# Patient Record
Sex: Female | Born: 1980 | Race: White | Hispanic: No | Marital: Married | Smoking: Never smoker
Health system: Southern US, Community
[De-identification: ages and names within clinical notes are randomized; demographics above are authoritative.]

## PROBLEM LIST (undated history)

## (undated) DIAGNOSIS — R87619 Unspecified abnormal cytological findings in specimens from cervix uteri: Secondary | ICD-10-CM

## (undated) HISTORY — DX: Unspecified abnormal cytological findings in specimens from cervix uteri: R87.619

## (undated) HISTORY — PX: OTHER SURGICAL HISTORY: SHX169

---
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
## 2002-08-13 HISTORY — PX: KNEE SURGERY: SHX244

## 2008-05-30 ENCOUNTER — Observation Stay: Payer: Self-pay | Admitting: Certified Nurse Midwife

## 2008-06-03 ENCOUNTER — Inpatient Hospital Stay: Payer: Self-pay

## 2011-01-03 ENCOUNTER — Inpatient Hospital Stay: Payer: Self-pay

## 2017-05-09 ENCOUNTER — Other Ambulatory Visit: Payer: Self-pay | Admitting: Certified Nurse Midwife

## 2017-07-30 ENCOUNTER — Other Ambulatory Visit: Payer: Self-pay | Admitting: Certified Nurse Midwife

## 2017-08-09 MED ORDER — DROSPIRENONE-ETHINYL ESTRADIOL 3-0.03 MG PO TABS: 1.0000 | ORAL_TABLET | Freq: Every day | ORAL | 0 refills | Status: DC

## 2017-08-09 NOTE — Addendum Note (Signed)
Addended by: Kathlene CoteSTANLEY, Jacy Brocker G on: 08/09/2017 02:59 PM   Modules accepted: Orders

## 2017-08-09 NOTE — Telephone Encounter (Signed)
Pt has been stuck in a cycle of being unable to get an apt or refill of OCP. She has scheduled her AE 09/02/17. Pt requesting refill of (YASMIN,ZARAH,SYEDA) 3-0.03 MG tablet to get her to her apt.

## 2017-08-22 ENCOUNTER — Encounter: Payer: Self-pay | Admitting: Obstetrics and Gynecology

## 2017-08-22 ENCOUNTER — Ambulatory Visit (INDEPENDENT_AMBULATORY_CARE_PROVIDER_SITE_OTHER): Payer: BC Managed Care – PPO | Admitting: Obstetrics and Gynecology

## 2017-08-22 VITALS — BP 120/80 | HR 74 | Ht 65.0 in | Wt 183.0 lb

## 2017-08-22 DIAGNOSIS — N3001 Acute cystitis with hematuria: Secondary | ICD-10-CM | POA: Diagnosis not present

## 2017-08-22 LAB — POCT URINALYSIS DIPSTICK
BILIRUBIN UA: NEGATIVE
GLUCOSE UA: NEGATIVE
Ketones, UA: NEGATIVE
Nitrite, UA: NEGATIVE
PH UA: 6.5 (ref 5.0–8.0)
Protein, UA: NEGATIVE
Spec Grav, UA: 1.01 (ref 1.010–1.025)

## 2017-08-22 MED ORDER — NITROFURANTOIN MONOHYD MACRO 100 MG PO CAPS: 100.0000 mg | ORAL_CAPSULE | Freq: Two times a day (BID) | ORAL | 0 refills | Status: AC

## 2017-08-22 NOTE — Progress Notes (Signed)
Chief Complaint  Patient presents with  . Cystitis    HPI:      Jodi Oneill is a 37 y.o. [id number] who LMP was Patient's last menstrual period was 08/10/2017., presents today for UTI sx of dysuria, frequency, urgency, hematuria for the past 3 days. No LBP, belly pain, fevers, vag sx. Hx of UTIs in the past.    Past Medical History:  Diagnosis Date  . Abnormal Pap smear of cervix    ascus    Past Surgical History:  Procedure Laterality Date  . KNEE SURGERY      Family History  Problem Relation Age of Onset  . Hypertension Mother   . Thyroid cancer Mother   . Uterine cancer Mother   . Hypertension Father     Social History   Socioeconomic History  . Marital status: Single    Spouse name: Not on file  . Number of children: Not on file  . Years of education: Not on file  . Highest education level: Not on file  Social Needs  . Financial resource strain: Not on file  . Food insecurity - worry: Not on file  . Food insecurity - inability: Not on file  . Transportation needs - medical: Not on file  . Transportation needs - non-medical: Not on file  Occupational History  . Not on file  Tobacco Use  . Smoking status: Never Smoker  . Smokeless tobacco: Never Used  Substance and Sexual Activity  . Alcohol use: Yes  . Drug use: No  . Sexual activity: Yes    Birth control/protection: Pill  Other Topics Concern  . Not on file  Social History Narrative  . Not on file     Current Outpatient Medications:  .  drospirenone-ethinyl estradiol (YASMIN,ZARAH,SYEDA) 3-0.03 MG tablet, Take 1 tablet by mouth daily., Disp: 84 tablet, Rfl: 0 .  nitrofurantoin, macrocrystal-monohydrate, (MACROBID) 100 MG capsule, Take 1 capsule (100 mg total) by mouth 2 (two) times daily for 5 days., Disp: 10 capsule, Rfl: 0   ROS:  Review of Systems  Constitutional: Negative for fever.  Gastrointestinal: Negative for blood in stool, constipation, diarrhea, nausea and vomiting.    Genitourinary: Positive for dysuria, frequency, hematuria and urgency. Negative for dyspareunia, flank pain, vaginal bleeding, vaginal discharge and vaginal pain.  Musculoskeletal: Negative for back pain.  Skin: Negative for rash.     OBJECTIVE:   Vitals:  BP 120/80   Pulse 74   Ht 5\' 5"  (1.651 m)   Wt 183 lb (83 kg)   LMP 08/10/2017   BMI 30.45 kg/m   Physical Exam  Constitutional: She is oriented to person, place, and time and well-developed, well-nourished, and in no distress.  Abdominal: There is no CVA tenderness.  Neurological: She is alert and oriented to person, place, and time.  Psychiatric: Affect and judgment normal.  Vitals reviewed.   Results: Results for orders placed or performed in visit on 08/22/17 (from the past 24 hour(s))  POCT Urinalysis Dipstick     Status: Abnormal   Collection Time: 08/22/17  8:44 AM  Result Value Ref Range   Color, UA yellow    Clarity, UA clear    Glucose, UA neg    Bilirubin, UA neg    Ketones, UA neg    Spec Grav, UA 1.010 1.010 - 1.025   Blood, UA large    pH, UA 6.5 5.0 - 8.0   Protein, UA neg    Urobilinogen, UA  0.2  or 1.0 E.U./dL   Nitrite, UA neg    Leukocytes, UA Moderate (2+) (A) Negative   Appearance     Odor       Assessment/Plan: Acute cystitis with hematuria - Rx macrobid. Check C&S. F/u prn. - Plan: POCT Urinalysis Dipstick, Urine Culture, nitrofurantoin, macrocrystal-monohydrate, (MACROBID) 100 MG capsule    Meds ordered this encounter  Medications  . nitrofurantoin, macrocrystal-monohydrate, (MACROBID) 100 MG capsule    Sig: Take 1 capsule (100 mg total) by mouth 2 (two) times daily for 5 days.    Dispense:  10 capsule    Refill:  0      Return if symptoms worsen or fail to improve.  Alicia B. Copland, PA-C 08/22/2017 8:45 AM

## 2017-08-22 NOTE — Patient Instructions (Signed)
I value your feedback and entrusting us with your care. If you get a [facility] patient survey, I would appreciate you taking the time to let us know about your experience today. Thank you! 

## 2017-08-24 LAB — URINE CULTURE

## 2017-09-02 ENCOUNTER — Ambulatory Visit (INDEPENDENT_AMBULATORY_CARE_PROVIDER_SITE_OTHER): Payer: BC Managed Care – PPO | Admitting: Certified Nurse Midwife

## 2017-09-02 ENCOUNTER — Encounter: Payer: Self-pay | Admitting: Certified Nurse Midwife

## 2017-09-02 VITALS — BP 120/70 | HR 66 | Ht 65.0 in | Wt 183.0 lb

## 2017-09-02 DIAGNOSIS — Z131 Encounter for screening for diabetes mellitus: Secondary | ICD-10-CM | POA: Diagnosis not present

## 2017-09-02 DIAGNOSIS — E782 Mixed hyperlipidemia: Secondary | ICD-10-CM | POA: Insufficient documentation

## 2017-09-02 DIAGNOSIS — Z124 Encounter for screening for malignant neoplasm of cervix: Secondary | ICD-10-CM | POA: Diagnosis not present

## 2017-09-02 DIAGNOSIS — Z23 Encounter for immunization: Secondary | ICD-10-CM | POA: Diagnosis not present

## 2017-09-02 DIAGNOSIS — Z01419 Encounter for gynecological examination (general) (routine) without abnormal findings: Secondary | ICD-10-CM | POA: Diagnosis not present

## 2017-09-02 DIAGNOSIS — Z309 Encounter for contraceptive management, unspecified: Secondary | ICD-10-CM | POA: Insufficient documentation

## 2017-09-02 MED ORDER — DROSPIRENONE-ETHINYL ESTRADIOL 3-0.03 MG PO TABS: 1.0000 | ORAL_TABLET | Freq: Every day | ORAL | 3 refills | Status: DC

## 2017-09-02 MED ORDER — EPINEPHRINE 0.3 MG/0.3ML IJ SOAJ: 0.3000 mg | Freq: Once | INTRAMUSCULAR | 2 refills | Status: DC | PRN

## 2017-09-02 NOTE — Progress Notes (Signed)
Gynecology Annual Exam  PCP: Patient, No Pcp Per  Chief Complaint:  Chief Complaint  Patient presents with  . Gynecologic Exam    History of Present Illness:Jodi Oneill is a 37 year old Caucasian/White female , G 2 P 2 0 0 2 , who presents for her annual gyn exam . She is not having any significant gynecological concerns. Has a history of stress urinary incontinence/ cystocele.Marland Kitchen Her symptoms have progressively improved with doing Kegel exercises and losing 25#, and is not currently problematic unless she has a cold with coughing and sneezing. Does not need to wear pads. At her last visit she complained of spotting between menses on her pill. This improved after taking additional estrogen pills x 2 cycles. She still has some light midcycle spotting x 2 days. ` Denies missing pills. No vaginitis symptoms.  Her menses are regular, are monthly and last 3-4 days with light-mod flow. Her LMP was 08/05/2017.   She denies dysmenorrhea  The patient's past medical history is notable for a history of two vaginal deliveries (2009/2012).  She is sexually active. She is currently using birth control pills for contraception.  Her most recent pap smear was obtained 06/11/2016 and was negative/negative HRHPV  Mammogram is not applicable.  There is a positive history of breast cancer in her maternal aunt. Genetic testing has not been done.  There is no family history of ovarian cancer.  The patient does not do monthly self breast exams.  The patient does not smoke.  The patient does drink 2-3 glasses of wine/week. The patient does not use illegal drugs.  The patient exercises regularly. Does yoga The patient does get adequate calcium in her diet.  She had a recent cholesterol screen in 2017 that was abnormal. TC 257, tri 180, LDL 144, HDL 77     Review of Systems: Review of Systems  Constitutional: Negative for chills, fever and weight loss.  HENT: Negative for congestion, sinus pain and  sore throat.   Eyes: Negative for blurred vision and pain.  Respiratory: Negative for hemoptysis, shortness of breath and wheezing.   Cardiovascular: Negative for chest pain, palpitations and leg swelling.  Gastrointestinal: Negative for abdominal pain, blood in stool, diarrhea, heartburn, nausea and vomiting.  Genitourinary: Negative for dysuria, frequency, hematuria and urgency.       Positive for BTB/ occasional SUI  Musculoskeletal: Negative for back pain, joint pain and myalgias.  Skin: Negative for itching and rash.  Neurological: Negative for dizziness, tingling and headaches.  Endo/Heme/Allergies: Negative for environmental allergies and polydipsia. Does not bruise/bleed easily.       Negative for hirsutism   Psychiatric/Behavioral: Negative for depression. The patient is not nervous/anxious and does not have insomnia.     Past Medical History:  Past Medical History:  Diagnosis Date  . Abnormal Pap smear of cervix    ascus    Past Surgical History:  Past Surgical History:  Procedure Laterality Date  . KNEE SURGERY Right 2004   fx patella    Family History:  Family History  Problem Relation Age of Onset  . Hypertension Mother   . Thyroid cancer Mother 47  . Uterine cancer Mother 33  . Skin cancer Mother   . Hypertension Father   . Breast cancer Maternal Aunt 46    Social History:  Social History   Socioeconomic History  . Marital status: Married    Spouse name: Not on file  . Number of children: 2  .  Years of education: Not on file  . Highest education level: Not on file  Social Needs  . Financial resource strain: Not on file  . Food insecurity - worry: Not on file  . Food insecurity - inability: Not on file  . Transportation needs - medical: Not on file  . Transportation needs - non-medical: Not on file  Occupational History  . Not on file  Tobacco Use  . Smoking status: Never Smoker  . Smokeless tobacco: Never Used  Substance and Sexual Activity    . Alcohol use: Yes    Alcohol/week: 1.2 - 1.8 oz    Types: 2 - 3 Glasses of wine per week  . Drug use: No  . Sexual activity: Yes    Birth control/protection: Pill  Other Topics Concern  . Not on file  Social History Narrative  . Not on file    Allergies:  Allergies  Allergen Reactions  . Penicillins Hives  and Beestings  Medications: Prior to Admission medications   Medication Sig Start Date End Date Taking? Authorizing Provider  drospirenone-ethinyl estradiol (YASMIN,ZARAH,SYEDA) 3-0.03 MG tablet Take 1 tablet by mouth daily. 08/09/17   Farrel ConnersGutierrez, Sameria Morss, CNM    Physical Exam Vitals: BP 120/70   Pulse 66   Ht 5\' 5"  (1.651 m)   Wt 183 lb (83 kg)   LMP 08/05/2017 (Approximate)   BMI 30.45 kg/m   General: WF in NAD HEENT: normocephalic, anicteric Neck: no thyroid enlargement, no palpable nodules, no cervical lymphadenopathy  Pulmonary: No increased work of breathing, CTAB Cardiovascular: RRR, without murmur  Breast: Breast symmetrical, no tenderness, no palpable nodules or masses, no skin or nipple retraction present, no nipple discharge.  No axillary, infraclavicular or supraclavicular lymphadenopathy. Abdomen: Soft, non-tender, non-distended.  Umbilicus without lesions.  No hepatomegaly or masses palpable. No evidence of hernia. Genitourinary:  External: Normal external female genitalia.  Normal urethral meatus, normal Bartholin's and Skene's glands.    Vagina: Normal vaginal mucosa, no evidence of prolapse.    Cervix: Grossly normal in appearance, scant brown-red discharge at os, non-tender  Uterus: Anteverted, normal size, shape, and consistency, mobile, and non-tender  Adnexa: No adnexal masses, non-tender  Rectal: deferred  Lymphatic: no evidence of inguinal lymphadenopathy Extremities: no edema, erythema, or tenderness Neurologic: Grossly intact Psychiatric: mood appropriate, affect full     Assessment: 37 y.o. G2P2002 normal gyn  exam Hyperlipidemia BTB on pills   Plan:   1) Breast cancer screening - recommend monthly self breast exam. Start mammograms at age 37.  2) Cervical cancer screening - Pap was done. ASCCP guidelines and rational discussed.  Patient opts for yearly screening interval  3) Contraception/ BTB- Wishes to continue on Yasmin. Declines change of pills or RX for additional estrogen. RX for Yasmin #3packs/ RF x 3 sent to RX. 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  4) Routine healthcare maintenance including cholesterol and diabetes labs ordered today. Flu vaccine today  5) RTO in 1 year and prn  Farrel Connersolleen Stephanne Greeley, CNM

## 2017-09-03 ENCOUNTER — Encounter: Payer: Self-pay | Admitting: Certified Nurse Midwife

## 2017-09-03 LAB — IGP,RFX APTIMA HPV ALL PTH: PAP SMEAR COMMENT: 0

## 2017-09-03 LAB — LIPID PANEL WITH LDL/HDL RATIO
CHOLESTEROL TOTAL: 239 mg/dL — AB (ref 100–199)
HDL: 81 mg/dL (ref >39)
LDL CALC: 136 mg/dL — AB (ref 0–99)
LDl/HDL Ratio: 1.7 ratio (ref 0.0–3.2)
Triglycerides: 110 mg/dL (ref 0–149)
VLDL Cholesterol Cal: 22 mg/dL (ref 5–40)

## 2017-09-03 LAB — HGB A1C W/O EAG: Hgb A1c MFr Bld: 5 % (ref 4.8–5.6)

## 2018-09-22 ENCOUNTER — Other Ambulatory Visit: Payer: Self-pay | Admitting: Certified Nurse Midwife

## 2018-09-29 NOTE — Progress Notes (Signed)
Gynecology Annual Exam  PCP: Patient, No Pcp Per  Chief Complaint:  Chief Complaint  Patient presents with  . Gynecologic Exam    History of Present Illness:Jodi Oneill is a 38 year old Caucasian/White female , G 2 P 2 0 0 2 , who presents for her annual gyn exam . She is not having any significant gyn complaints.  She continues to complain of mild BTB mid cycle x 2 days. Has taken supplemental estrogen in the past with some relief, but declined further treatment.  Her menses are regular, are monthly and last 3-4 days with light-mod flow. Her LMP was  09/01/2018 (she is actually starting a menses today)  She has mild dysmenorrhea relieved with Tylenol. The patient's past medical history is notable for a history of two vaginal deliveries (2009/2012).  She is sexually active. She is currently using birth control pills (yasmin generic) for contraception.  Her most recent pap smear was obtained 1/ 21/2019 and was NIL  Mammogram is not applicable.  There is a positive history of breast cancer in her maternal aunt. Genetic testing is not indicated. There is no family history of ovarian cancer.  The patient does do monthly self breast exams.  The patient does not smoke.  The patient does drink 2 glasses of wine/month The patient does not use illegal drugs.  The patient exercises regularly. Does yoga 3-4 times a week and walks her dogs daily. The patient does get adequate calcium in her diet.  She had a recent cholesterol screen in 2019 that was borderline TC 239, tri 110, LDL 136 HDL 81     Review of Systems: Review of Systems  Constitutional: Negative for chills, fever and weight loss.  HENT: Negative for congestion, sinus pain and sore throat.   Eyes: Negative for blurred vision and pain.  Respiratory: Negative for hemoptysis, shortness of breath and wheezing.   Cardiovascular: Negative for chest pain, palpitations and leg swelling.  Gastrointestinal: Negative for  abdominal pain, blood in stool, diarrhea, heartburn, nausea and vomiting.  Genitourinary: Negative for dysuria, frequency, hematuria and urgency.       Positive for BTB/ occasional SUI  Musculoskeletal: Negative for back pain, joint pain and myalgias.  Skin: Negative for itching and rash.  Neurological: Negative for dizziness, tingling and headaches.  Endo/Heme/Allergies: Negative for environmental allergies and polydipsia. Does not bruise/bleed easily.       Negative for hirsutism   Psychiatric/Behavioral: Negative for depression. The patient is not nervous/anxious and does not have insomnia.     Past Medical History:  Past Medical History:  Diagnosis Date  . Abnormal Pap smear of cervix    ascus    Past Surgical History:  Past Surgical History:  Procedure Laterality Date  . KNEE SURGERY Right 2004   fx patella    Family History:  Family History  Problem Relation Age of Onset  . Hypertension Mother   . Thyroid cancer Mother 49  . Uterine cancer Mother 82  . Melanoma Mother   . Hypertension Father   . Breast cancer Maternal Aunt 42    Social History:  Social History   Socioeconomic History  . Marital status: Married    Spouse name: Not on file  . Number of children: 2  . Years of education: Not on file  . Highest education level: Not on file  Occupational History  . Not on file  Social Needs  . Financial resource strain: Not on file  .  Food insecurity:    Worry: Not on file    Inability: Not on file  . Transportation needs:    Medical: Not on file    Non-medical: Not on file  Tobacco Use  . Smoking status: Never Smoker  . Smokeless tobacco: Never Used  Substance and Sexual Activity  . Alcohol use: Yes    Alcohol/week: 2.0 - 3.0 standard drinks    Types: 2 - 3 Glasses of wine per week  . Drug use: No  . Sexual activity: Yes    Birth control/protection: Pill  Lifestyle  . Physical activity:    Days per week: 5 days    Minutes per session: 30 min  .  Stress: Only a little  Relationships  . Social connections:    Talks on phone: More than three times a week    Gets together: Three times a week    Attends religious service: More than 4 times per year    Active member of club or organization: No    Attends meetings of clubs or organizations: Never    Relationship status: Married  . Intimate partner violence:    Fear of current or ex partner: No    Emotionally abused: No    Physically abused: No    Forced sexual activity: No  Other Topics Concern  . Not on file  Social History Narrative  . Not on file    Allergies:  Allergies  Allergen Reactions  . Other Anaphylaxis    Bee stings  . Penicillins Hives  and Beestings  Medications: Current Outpatient Medications on File Prior to Visit  Medication Sig Dispense Refill  . drospirenone-ethinyl estradiol (YASMIN,ZARAH,SYEDA) 3-0.03 MG tablet TAKE 1 TABLET BY MOUTH EVERY DAY 84 tablet 3   No current facility-administered medications on file prior to visit.        Physical Exam Vitals: BP 112/70   Pulse 78   Ht 5\' 5"  (1.651 m)   Wt 188 lb (85.3 kg)   LMP 09/01/2018 (Exact Date)   BMI 31.28 kg/m  General: WF in NAD HEENT: normocephalic, anicteric Neck: no thyroid enlargement, no palpable nodules, no cervical lymphadenopathy  Pulmonary: No increased work of breathing, CTAB Cardiovascular: RRR, without murmur  Breast: Breast symmetrical, no tenderness, no palpable nodules or masses, no skin or nipple retraction present, no nipple discharge.  No axillary, infraclavicular or supraclavicular lymphadenopathy. Abdomen: Soft, non-tender, non-distended.  Umbilicus without lesions.  No hepatomegaly or masses palpable. No evidence of hernia. Genitourinary:  External: Normal external female genitalia.  Normal urethral meatus, normal Bartholin's and Skene's glands.    Vagina: Normal vaginal mucosa, no evidence of prolapse, small amount of blood in vault.    Cervix: Grossly normal in  appearance, everted, non-tender  Uterus: Anteverted, normal size, shape, and consistency, mobile, and non-tender  Adnexa: No adnexal masses, non-tender  Rectal: deferred  Lymphatic: no evidence of inguinal lymphadenopathy Extremities: no edema, erythema, or tenderness Neurologic: Grossly intact Psychiatric: mood appropriate, affect full     Assessment: 38 y.o. G2P2002 normal gyn exam BTB on pills   Plan:   1) Breast cancer screening - recommend monthly self breast exam. Start mammograms at age 38.  2) Cervical cancer screening - Pap was done. ASCCP guidelines and rational discussed.  Patient opts for yearly screening interval  3) Contraception/ BTB- Wishes to continue on Yasmin. Declines change of pills or RX for additional estrogen. RX for Yasmin #3packs/ RF x 3 was already refilled earlier this month. 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  4) Routine healthcare maintenance including cholesterol and diabetes labs UTD. Reports getting a Td in 03/12/2016 after a hand injury. Next due in 10 years 5) RTO in 1 year and prn  Farrel Conners, CNM

## 2018-09-30 ENCOUNTER — Encounter: Payer: Self-pay | Admitting: Certified Nurse Midwife

## 2018-09-30 ENCOUNTER — Ambulatory Visit (INDEPENDENT_AMBULATORY_CARE_PROVIDER_SITE_OTHER): Payer: BC Managed Care – PPO | Admitting: Certified Nurse Midwife

## 2018-09-30 ENCOUNTER — Other Ambulatory Visit (HOSPITAL_COMMUNITY)
Admission: RE | Admit: 2018-09-30 | Discharge: 2018-09-30 | Disposition: A | Discharge status: Home / Self Care | Payer: BC Managed Care – PPO | Source: Ambulatory Visit | Attending: Certified Nurse Midwife | Admitting: Certified Nurse Midwife

## 2018-09-30 VITALS — BP 112/70 | HR 78 | Ht 65.0 in | Wt 188.0 lb

## 2018-09-30 DIAGNOSIS — Z124 Encounter for screening for malignant neoplasm of cervix: Secondary | ICD-10-CM

## 2018-09-30 DIAGNOSIS — Z01419 Encounter for gynecological examination (general) (routine) without abnormal findings: Secondary | ICD-10-CM | POA: Diagnosis present

## 2018-09-30 MED ORDER — EPINEPHRINE 0.3 MG/0.3ML IJ SOAJ: 0.3000 mg | Freq: Once | INTRAMUSCULAR | 2 refills | Status: DC | PRN

## 2018-10-02 LAB — CYTOLOGY - PAP
Diagnosis: NEGATIVE
HPV: NOT DETECTED

## 2019-01-12 HISTORY — PX: OTHER SURGICAL HISTORY: SHX169

## 2019-04-17 ENCOUNTER — Encounter: Payer: Self-pay | Admitting: Certified Nurse Midwife

## 2019-04-17 ENCOUNTER — Other Ambulatory Visit: Payer: Self-pay

## 2019-04-17 ENCOUNTER — Ambulatory Visit (INDEPENDENT_AMBULATORY_CARE_PROVIDER_SITE_OTHER): Payer: BC Managed Care – PPO | Admitting: Certified Nurse Midwife

## 2019-04-17 ENCOUNTER — Telehealth: Payer: Self-pay | Admitting: Certified Nurse Midwife

## 2019-04-17 VITALS — BP 112/76 | HR 76 | Ht 65.0 in | Wt 195.0 lb

## 2019-04-17 DIAGNOSIS — Z23 Encounter for immunization: Secondary | ICD-10-CM

## 2019-04-17 DIAGNOSIS — N6310 Unspecified lump in the right breast, unspecified quadrant: Secondary | ICD-10-CM | POA: Diagnosis not present

## 2019-04-17 NOTE — Addendum Note (Signed)
Addended by: Meryl Dare on: 04/17/2019 09:23 AM   Modules accepted: Orders

## 2019-04-17 NOTE — Progress Notes (Signed)
Obstetrics & Gynecology Office Visit   Chief Complaint:  Chief Complaint  Patient presents with  . Breast Problem    Right breast lump    History of Present Illness: 38 year old G2 P2002 with LMP 04/15/2019 presents with complaints of feeling a tender lump in the upper outer right breast x 1 week. She reports feeling the tenderness first, then feeling a 1-2 cm mass in the RUOQ. She feels like the lump has gotten smaller. She has no history of having mammograms in the past. Maternal aunt had breast cancer at age 38. Mother has had thyroid and uterine cancer and also had melanoma. Purity uses OCPs for contraception.  Caffeine intake is 2 cups of tea and 2 cans of diet Coke a day. Last breast exam was 09/2018 and was normal.   Review of Systems:  ROS   Past Medical History:  Past Medical History:  Diagnosis Date  . Abnormal Pap smear of cervix    ascus    Past Surgical History:  Past Surgical History:  Procedure Laterality Date  . KNEE SURGERY Right 2004   fx patella    Gynecologic History: Patient's last menstrual period was 04/15/2019.  Obstetric History: [id number]  Family History:  Family History  Problem Relation Age of Onset  . Hypertension Mother   . Thyroid cancer Mother 50  . Uterine cancer Mother 52  . Melanoma Mother   . Hypertension Father   . Breast cancer Maternal Aunt 63    Social History:  Social History   Socioeconomic History  . Marital status: Married    Spouse name: Not on file  . Number of children: 2  . Years of education: Not on file  . Highest education level: Not on file  Occupational History  . Not on file  Social Needs  . Financial resource strain: Not on file  . Food insecurity    Worry: Not on file    Inability: Not on file  . Transportation needs    Medical: Not on file    Non-medical: Not on file  Tobacco Use  . Smoking status: Never Smoker  . Smokeless tobacco: Never Used  Substance and Sexual Activity  . Alcohol use:  Yes    Alcohol/week: 2.0 - 3.0 standard drinks    Types: 2 - 3 Glasses of wine per week  . Drug use: No  . Sexual activity: Yes    Birth control/protection: Pill  Lifestyle  . Physical activity    Days per week: 5 days    Minutes per session: 30 min  . Stress: Only a little  Relationships  . Social connections    Talks on phone: More than three times a week    Gets together: Three times a week    Attends religious service: More than 4 times per year    Active member of club or organization: No    Attends meetings of clubs or organizations: Never    Relationship status: Married  . Intimate partner violence    Fear of current or ex partner: No    Emotionally abused: No    Physically abused: No    Forced sexual activity: No  Other Topics Concern  . Not on file  Social History Narrative  . Not on file    Allergies:  Allergies  Allergen Reactions  . Other Anaphylaxis    Bee stings  . Penicillins Hives    Medications: Prior to Admission medications   Medication Sig  Start Date End Date Taking? Authorizing Provider  drospirenone-ethinyl estradiol (YASMIN,ZARAH,SYEDA) 3-0.03 MG tablet TAKE 1 TABLET BY MOUTH EVERY DAY 09/22/18  Yes Farrel ConnersGutierrez, Gabi Mcfate, CNM  EPINEPHrine 0.3 mg/0.3 mL IJ SOAJ injection Inject 0.3 mLs (0.3 mg total) into the muscle once as needed for up to 1 dose (hypersensitivity reaction). 09/30/18  Yes Farrel ConnersGutierrez, Dosha Broshears, CNM    Physical Exam Vitals: BP 112/76 (BP Location: Right Arm, Patient Position: Sitting, Cuff Size: Normal)   Pulse 76   Ht 5\' 5"  (1.651 m)   Wt 195 lb (88.5 kg)   LMP 04/15/2019   BMI 32.45 kg/m      Physical Exam  Constitutional: She is oriented to person, place, and time. She appears well-developed and well-nourished. No distress.  Respiratory: Effort normal.  Musculoskeletal: Normal range of motion.  Neurological: She is alert and oriented to person, place, and time.  Skin: Skin is warm and dry.  Psychiatric: She has a normal  mood and affect.  Breasts: examined lying down and sitting up. Symmetrical in size, no skin or nipple changes, no nipple discharge. Exam while lying: no masses palpable in either breast. When sitting there was no dominant masses palpated, but some fibroglandular tissue/ fullness noted in upper outer quadrant of right breast, that was slightly tender   Assessment: 38 y.o. [id number] with possible mass in right upper outer quadrant  Plan: Schedule diagnostic mammogram and right breast ultrasound.  Farrel Connersolleen Jacarra Bobak, CNM

## 2019-04-17 NOTE — Telephone Encounter (Signed)
Patient aware of mammogram at [facility] on 04/23/2019 at 10:00 am

## 2019-04-23 ENCOUNTER — Ambulatory Visit
Admission: RE | Admit: 2019-04-23 | Discharge: 2019-04-23 | Disposition: A | Discharge status: Home / Self Care | Payer: BC Managed Care – PPO | Source: Ambulatory Visit | Attending: Certified Nurse Midwife | Admitting: Certified Nurse Midwife

## 2019-04-23 DIAGNOSIS — N6310 Unspecified lump in the right breast, unspecified quadrant: Secondary | ICD-10-CM | POA: Insufficient documentation

## 2019-08-14 DIAGNOSIS — D0362 Melanoma in situ of left upper limb, including shoulder: Secondary | ICD-10-CM

## 2019-08-14 HISTORY — PX: OTHER SURGICAL HISTORY: SHX169

## 2019-08-14 HISTORY — DX: Melanoma in situ of left upper limb, including shoulder: D03.62

## 2019-08-23 ENCOUNTER — Other Ambulatory Visit: Payer: Self-pay | Admitting: Certified Nurse Midwife

## 2019-11-09 ENCOUNTER — Other Ambulatory Visit: Payer: Self-pay | Admitting: Certified Nurse Midwife

## 2020-02-11 ENCOUNTER — Other Ambulatory Visit: Payer: Self-pay | Admitting: Certified Nurse Midwife

## 2020-02-23 ENCOUNTER — Encounter: Payer: Self-pay | Admitting: Certified Nurse Midwife

## 2020-02-23 ENCOUNTER — Other Ambulatory Visit: Payer: Self-pay

## 2020-02-23 ENCOUNTER — Ambulatory Visit (INDEPENDENT_AMBULATORY_CARE_PROVIDER_SITE_OTHER): Payer: BC Managed Care – PPO | Admitting: Certified Nurse Midwife

## 2020-02-23 VITALS — BP 130/80 | HR 72 | Ht 65.0 in | Wt 205.0 lb

## 2020-02-23 DIAGNOSIS — Z01419 Encounter for gynecological examination (general) (routine) without abnormal findings: Secondary | ICD-10-CM | POA: Diagnosis not present

## 2020-02-23 DIAGNOSIS — N921 Excessive and frequent menstruation with irregular cycle: Secondary | ICD-10-CM

## 2020-02-23 DIAGNOSIS — Z3041 Encounter for surveillance of contraceptive pills: Secondary | ICD-10-CM

## 2020-02-23 MED ORDER — LEVONORGEST-ETH ESTRAD 91-DAY 0.15-0.03 &0.01 MG PO TABS: 1.0000 | ORAL_TABLET | Freq: Every day | ORAL | 4 refills | Status: DC

## 2020-02-23 NOTE — Progress Notes (Signed)
Gynecology Annual Exam  PCP: Farrel Conners, CNM  Chief Complaint:  Chief Complaint  Patient presents with  . Gynecologic Exam    History of Present Illness:Jodi Oneill is a 39 year old Caucasian/White female , G 2 P 2 0 0 2 , who presents for her annual gyn exam . She is not having any significant gyn complaints.  She continues to complain of mild BTB mid cycle x 3-4 days on her Yasmin generic. Has taken supplemental estrogen in the past with some relief, but declined further treatment.  Her menses are regular, are monthly and last 3-4 days with light-mod flow. Her LMP was 02/18/2020 (she is actually starting a menses today) Denies any significant dysmenorrhea. The patient's past medical history is notable for a history of two vaginal deliveries (2009/2012).Since her last exam in 09/30/2018 she had a melanoma removed from her left upper arm. Lymph node biopsy pathology was negative.  She is sexually active. She is currently using birth control pills (yasmin generic) for contraception.  Her most recent pap smear was obtained 09/30/18 and was NIL/neg HRHPV  Mammogram is not applicable.  There is a positive history of breast cancer in her maternal aunt. Genetic testing is not indicated. There is no family history of ovarian cancer.  The patient does do monthly self breast exams.  The patient does not smoke.  The patient does drink 2 glasses of wine/month The patient does not use illegal drugs.  The patient exercises regularly. Does yoga 3-4 times a week and walks her dogs daily. The patient does get adequate calcium in her diet.  She had a recent cholesterol screen in 2019 that was borderline TC 239, tri 110, LDL 136 HDL 81     Review of Systems: Review of Systems  Constitutional: Negative for chills, fever and weight loss.  HENT: Negative for congestion, sinus pain and sore throat.   Eyes: Negative for blurred vision and pain.  Respiratory: Negative for hemoptysis,  shortness of breath and wheezing.   Cardiovascular: Negative for chest pain, palpitations and leg swelling.  Gastrointestinal: Negative for abdominal pain, blood in stool, diarrhea, heartburn, nausea and vomiting.  Genitourinary: Negative for dysuria, frequency, hematuria and urgency.       Positive for BTB  Musculoskeletal: Negative for back pain, joint pain and myalgias.  Skin: Negative for itching and rash.  Neurological: Negative for dizziness, tingling and headaches.  Endo/Heme/Allergies: Negative for environmental allergies and polydipsia. Does not bruise/bleed easily.       Negative for hirsutism   Psychiatric/Behavioral: Negative for depression. The patient is not nervous/anxious and does not have insomnia.     Past Medical History:  Past Medical History:  Diagnosis Date  . Abnormal Pap smear of cervix    ascus  . 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 excision of melanoma     left upper arm  . KNEE SURGERY Right 2004   fx patella  . lymph node removal  2021    Family History:  Family History  Problem Relation Age of Onset  . Hypertension Mother   . Thyroid cancer Mother 10  . Uterine cancer Mother 103  . Melanoma Mother   . Hypertension Father   . Breast cancer Maternal Aunt 15    Social History:  Social History   Socioeconomic History  . Marital status: Married  Spouse name: Not on file  . Number of children: 2  . Years of education: Not on file  . Highest education level: Not on file  Occupational History  . Not on file  Tobacco Use  . Smoking status: Never Smoker  . Smokeless tobacco: Never Used  Vaping Use  . Vaping Use: Never used  Substance and Sexual Activity  . Alcohol use: Yes    Alcohol/week: 2.0 - 3.0 standard drinks    Types: 2 - 3 Glasses of wine per week  . Drug use: No  . Sexual activity: Yes    Birth control/protection: Pill  Other Topics  Concern  . Not on file  Social History Narrative  . Not on file   Social Determinants of Health   Financial Resource Strain:   . Difficulty of Paying Living Expenses:   Food Insecurity:   . Worried About Programme researcher, broadcasting/film/video in the Last Year:   . Barista in the Last Year:   Transportation Needs:   . Freight forwarder (Medical):   Marland Kitchen Lack of Transportation (Non-Medical):   Physical Activity:   . Days of Exercise per Week:   . Minutes of Exercise per Session:   Stress:   . Feeling of Stress :   Social Connections:   . Frequency of Communication with Friends and Family:   . Frequency of Social Gatherings with Friends and Family:   . Attends Religious Services:   . Active Member of Clubs or Organizations:   . Attends Banker Meetings:   Marland Kitchen Marital Status:   Intimate Partner Violence:   . Fear of Current or Ex-Partner:   . Emotionally Abused:   Marland Kitchen Physically Abused:   . Sexually Abused:     Allergies:  Allergies  Allergen Reactions  . Other Anaphylaxis    Bee stings, walnuts  . Penicillins Hives    Other reaction(s): Unknown Other reaction(s): Unknown     Medications:  Current Outpatient Medications:  .  EPINEPHrine 0.3 mg/0.3 mL IJ SOAJ injection, Inject 0.3 mLs (0.3 mg total) into the muscle once as needed for up to 1 dose (hypersensitivity reaction)., Disp: 1 Device, Rfl: 2 .  loratadine (CLARITIN) 10 MG tablet, Take 1 tablet by mouth daily., Disp: , Rfl:  Yasmin generic   Physical Exam Vitals: BP 130/80   Pulse 72   Ht 5\' 5"  (1.651 m)   Wt 205 lb (93 kg)   LMP 02/18/2020 (Exact Date)   BMI 34.11 kg/m  General: WF in NAD HEENT: normocephalic, anicteric Neck: no thyroid enlargement, no palpable nodules, no cervical lymphadenopathy  Pulmonary: No increased work of breathing, CTAB Cardiovascular: RRR, without murmur  Breast: Breast symmetrical, no tenderness, no palpable nodules or masses, no skin or nipple retraction present, no  nipple discharge.  No axillary, infraclavicular or supraclavicular lymphadenopathy. Abdomen: Soft, non-tender, non-distended.  Umbilicus without lesions.  No hepatomegaly or masses palpable. No evidence of hernia. Genitourinary:  External: Normal external female genitalia.  Normal urethral meatus, normal Bartholin's and Skene's glands.    Vagina: Normal vaginal mucosa, no evidence of prolapse, small amount of blood in vault.    Cervix: Grossly normal in appearance, everted, non-tender  Uterus: Anteverted, normal size, shape, and consistency, mobile, and non-tender  Adnexa: No adnexal masses, non-tender  Rectal: deferred  Lymphatic: no evidence of inguinal lymphadenopathy Extremities: no edema, erythema, or tenderness Neurologic: Grossly intact Psychiatric: mood appropriate, affect full     Assessment: 39 y.o. 30  normal gyn exam BTB on pills   Plan:   1) Breast cancer screening - recommend monthly self breast exam. Start mammograms at age 69.  2) Cervical cancer screening - Pap was not done. ASCCP guidelines and rational discussed.  Patient opts for every 3 year screening interval  3) Contraception/ BTB- trial of extended cycle pills-Seasonique. If BTB continues consider pelvic ultrasound  4) Routine healthcare maintenance including cholesterol and diabetes labs UTD. Reports getting a Td in 03/12/2016 after a hand injury. Next due in 10 years  5) RTO in 1 year and prn  Farrel Conners, CNM

## 2020-03-06 ENCOUNTER — Encounter: Payer: Self-pay | Admitting: Certified Nurse Midwife

## 2020-03-11 ENCOUNTER — Other Ambulatory Visit: Payer: Self-pay | Admitting: Certified Nurse Midwife

## 2020-05-09 ENCOUNTER — Telehealth: Payer: Self-pay

## 2020-05-09 NOTE — Telephone Encounter (Signed)
Pharm faxed request for refill of Drospirenone - EE 3-0.03 mg tab; denied d/t Amethia eRx'd 02/23/20 #84 c 4rf.

## 2020-08-02 IMAGING — MG MM DIGITAL DIAGNOSTIC BILAT W/ TOMO W/ CAD
6 of 10 series · 6 of 30 positions shown · non-contrast
Comparison: Previous exam(s).

CLINICAL DATA: Patient presents for a new palpable concern in the
upper outer right breast that can be tender and fluctuates with her
menstrual cycle.

EXAM:
DIGITAL DIAGNOSTIC BILATERAL MAMMOGRAM WITH CAD AND TOMO
ULTRASOUND RIGHT BREAST

[L MLO synth-2D]
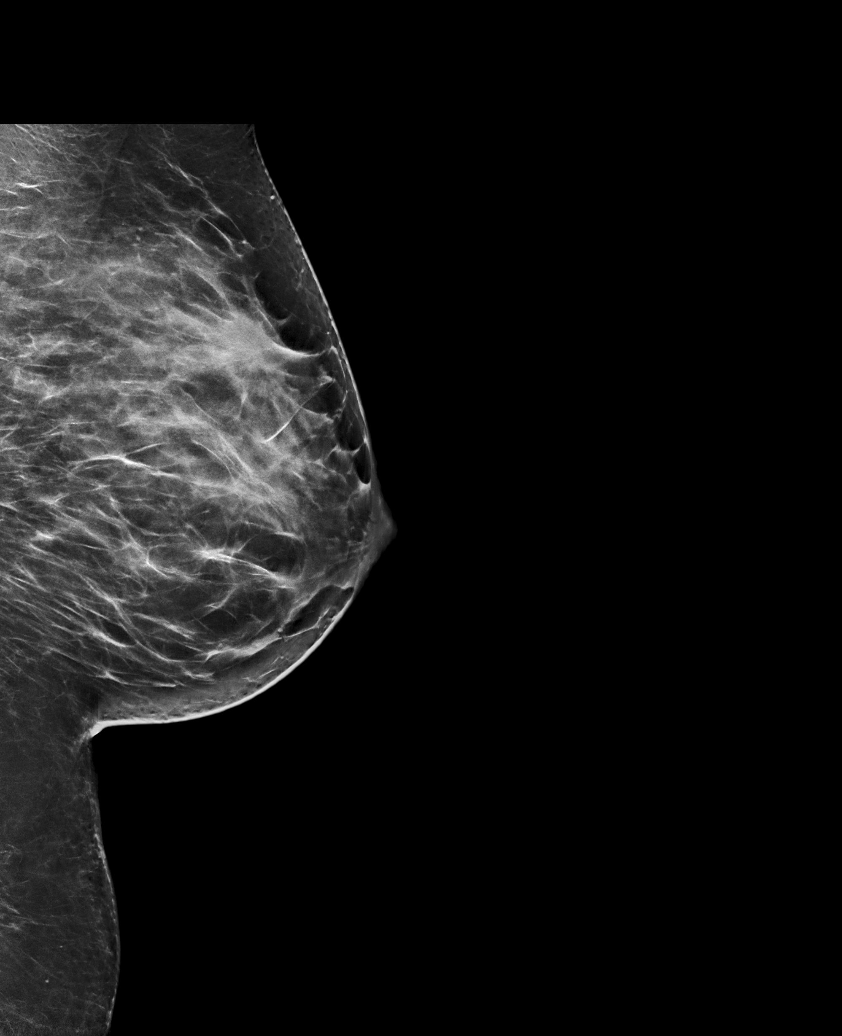

[R MLO synth-2D]
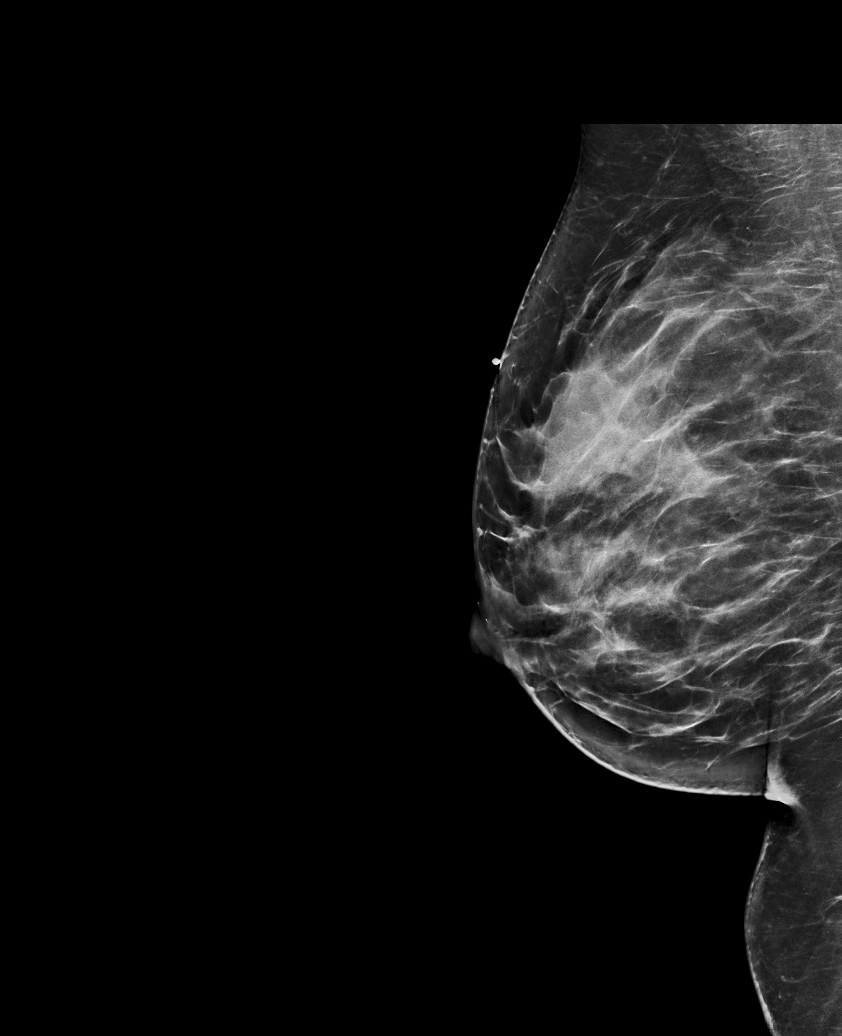

[R TAN synth-2D]
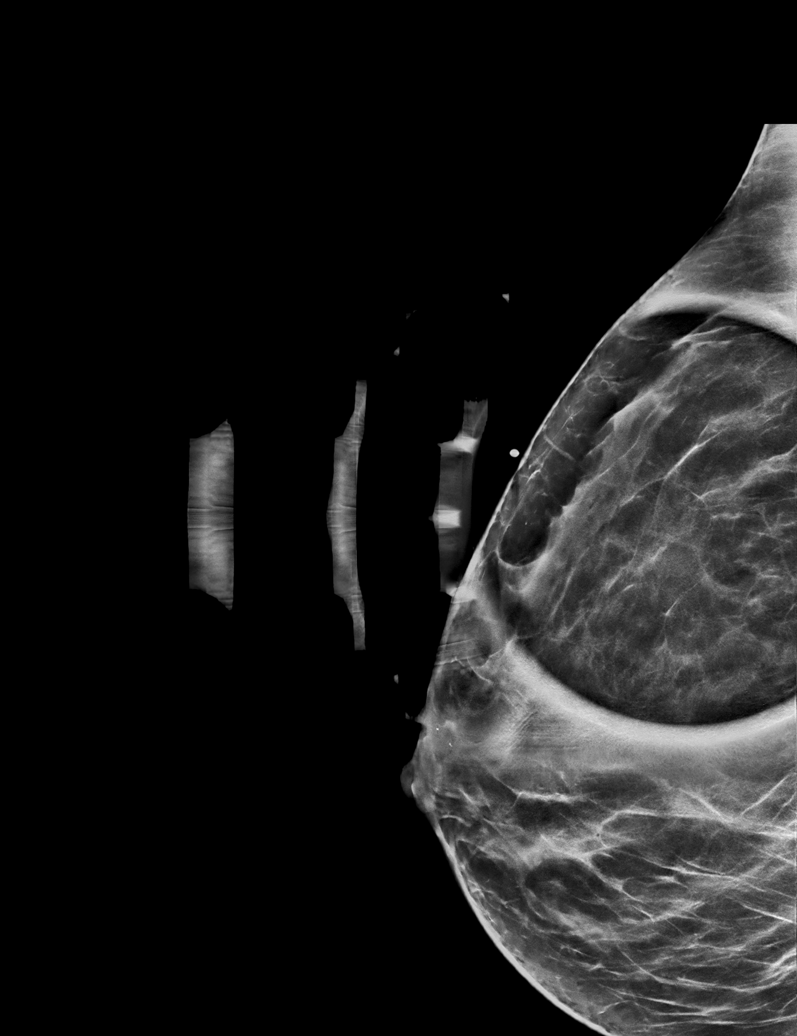

[L CC synth-2D]
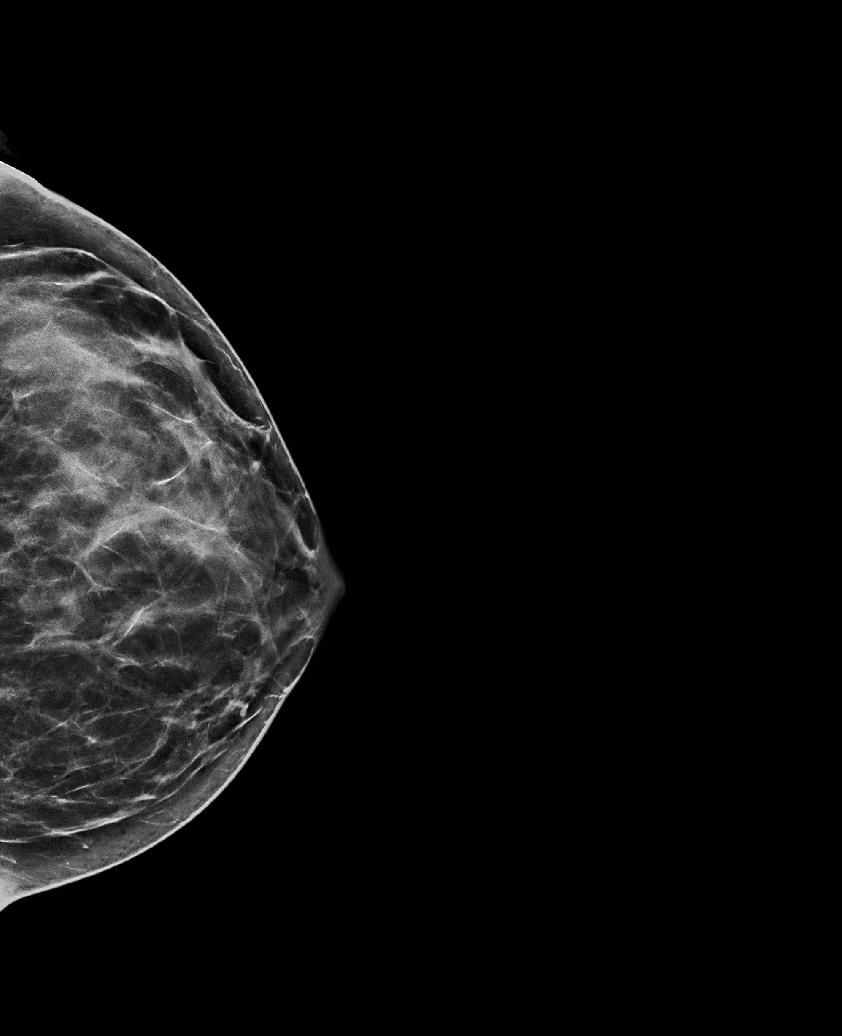

[R CC synth-2D]
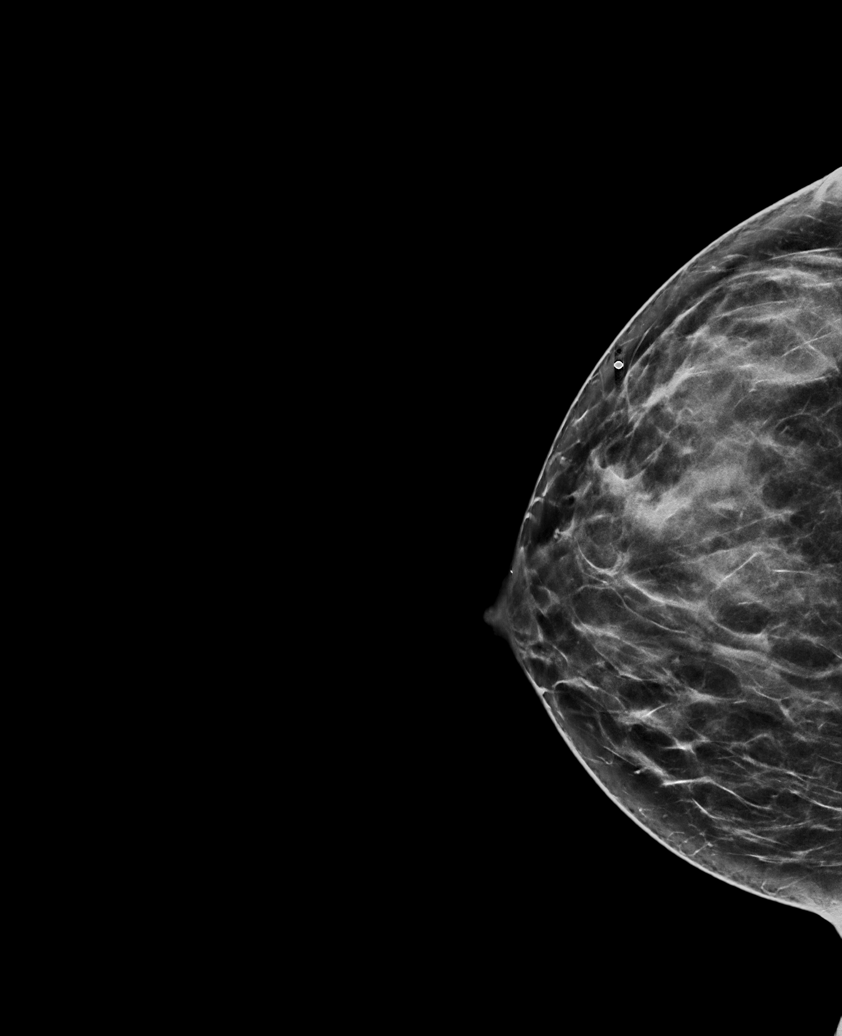

[R TAN tomo · tomo slice 33/66.0]
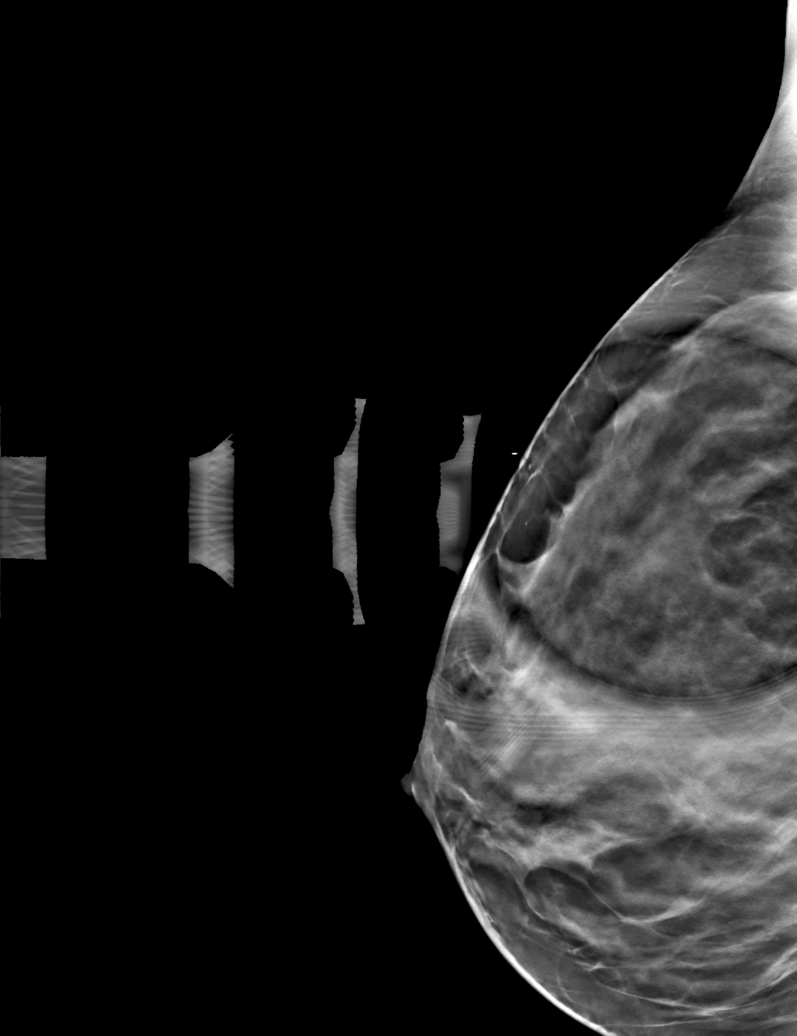

[6 of 30 positions shown; findings below may reference images not displayed]

ACR Breast Density Category c: The breast tissue is heterogeneously
dense, which may obscure small masses.
FINDINGS: Mammogram: There is an area of asymmetric tissue in the upper-outer
quadrant of the right breast corresponding to the palpable site of
concern. Spot compression tomosynthesis views were performed
demonstrating no discrete underlying mass or distortion. No
suspicious mass, distortion, or microcalcifications are identified
to suggest presence of malignancy.

Mammographic images were processed with CAD.

On physical exam, I palpate soft breast tissue in the upper-outer
quadrant the right breast without a focal palpable abnormality.

The patient reports this area is less tender and the palpable area
field less discrete than previously.

Targeted ultrasound is performed of the palpable area of concern in
the upper-outer quadrant of the right breast demonstrating no
discrete cystic or solid mass.
IMPRESSION: No mammographic or sonographic evidence of malignancy at the site of
palpable concern in the upper outer right breast.

RECOMMENDATION:
Recommend further workup be on a clinical basis. Patient was
counseled to monitor this palpable site and return if it should
become persistently tender or enlarge rather than fluctuate with her
menstrual cycle.

I have discussed the findings and recommendations with the patient.
If applicable, a reminder letter will be sent to the patient
regarding the next appointment.

BI-RADS CATEGORY  1: Negative.

## 2021-03-21 ENCOUNTER — Encounter: Payer: Self-pay | Admitting: Obstetrics

## 2021-03-21 ENCOUNTER — Ambulatory Visit (INDEPENDENT_AMBULATORY_CARE_PROVIDER_SITE_OTHER): Payer: BC Managed Care – PPO | Admitting: Obstetrics

## 2021-03-21 ENCOUNTER — Other Ambulatory Visit: Payer: Self-pay

## 2021-03-21 VITALS — BP 110/70 | Ht 65.0 in | Wt 210.0 lb

## 2021-03-21 DIAGNOSIS — Z01419 Encounter for gynecological examination (general) (routine) without abnormal findings: Secondary | ICD-10-CM

## 2021-03-21 DIAGNOSIS — Z3041 Encounter for surveillance of contraceptive pills: Secondary | ICD-10-CM

## 2021-03-21 DIAGNOSIS — Z124 Encounter for screening for malignant neoplasm of cervix: Secondary | ICD-10-CM

## 2021-03-21 MED ORDER — LEVONORGEST-ETH ESTRAD 91-DAY 0.15-0.03 &0.01 MG PO TABS: 1.0000 | ORAL_TABLET | Freq: Every day | ORAL | 4 refills | Status: DC

## 2021-03-21 NOTE — Progress Notes (Signed)
Gynecology Annual Exam   PCP: Farrel Conners, CNM (Inactive)  Chief Complaint:  Chief Complaint  Patient presents with   Gynecologic Exam    No concerns    History of Present Illness: Patient is a 40 y.o. G2P2002 presents for annual exam. The patient has no complaints today. 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 Average Interval: regular, 28 days Duration of flow: 4 days Heavy Menses: no Clots: no Intermenstrual Bleeding: no Postcoital Bleeding: no Dysmenorrhea: no  The patient is sexually active. She currently uses OCP (estrogen/progesterone) for contraception. She denies dyspareunia.  The patient does perform self breast exams.  There is no notable family history of breast or ovarian cancer in her family.  The patient wears seatbelts: yes.   The patient has regular exercise: yes.  She does yoga 6 times weekly.  The patient denies current symptoms of depression.    Review of Systems: Review of Systems  Constitutional: Negative.   HENT: Negative.    Eyes: Negative.   Respiratory: Negative.    Cardiovascular: Negative.   Gastrointestinal: Negative.   Genitourinary: Negative.   Musculoskeletal: Negative.   Skin: Negative.   Neurological: Negative.   Endo/Heme/Allergies: Negative.    Past Medical History:  Patient Active Problem List   Diagnosis Date Noted   Contraception management 09/02/2017   Mixed hyperlipidemia 09/02/2017    Past Surgical History:  Past Surgical History:  Procedure Laterality Date   excision of melanoma     left upper arm   KNEE SURGERY Right 2004   fx patella   lymph node removal  2021    Gynecologic History:  Patient's last menstrual period was 03/09/2021 (exact date). Contraception: OCP (estrogen/progesterone) Last Pap: Results were: no abnormalities   Obstetric History: [id number]  Family History:   Family History  Problem Relation Age of Onset   Hypertension Mother    Thyroid cancer Mother 87   Uterine cancer Mother 18   Melanoma Mother    Hypertension Father    Breast cancer Maternal Aunt 55    Social History:  Social History   Socioeconomic History   Marital status: Married    Spouse name: Not on file   Number of children: 2   Years of education: Not on file   Highest education level: Not on file  Occupational History   Not on file  Tobacco Use   Smoking status: Never   Smokeless tobacco: Never  Vaping Use   Vaping Use: Never used  Substance and Sexual Activity   Alcohol use: Yes    Alcohol/week: 2.0 - 3.0 standard drinks    Types: 2 - 3 Glasses of wine per week   Drug use: No   Sexual activity: Yes    Birth control/protection: Pill  Other Topics Concern   Not on file  Social History Narrative   Not on file   Social Determinants of Health   Financial Resource Strain: Not on file  Food Insecurity: Not on file  Transportation Needs: Not on file  Physical Activity: Not on file  Stress: Not on file  Social Connections: Not on file  Intimate Partner Violence: Not on file    Allergies:  Allergies  Allergen Reactions   Other Anaphylaxis    Bee stings, walnuts   Penicillins Hives    Other reaction(s): Unknown Other reaction(s): Unknown     Medications:  Prior to Admission medications   Medication Sig Start Date End Date Taking? Authorizing Provider  EPINEPHRINE 0.3 mg/0.3 mL IJ SOAJ injection INJECT 0.3 MLS INTO THE MUSCLE ONCE AS NEEDED FOR UP TO 1 DOSE (HYPERSENSITIVITY REACTION). 03/11/20  Yes Farrel Conners, CNM  Levonorgestrel-Ethinyl Estradiol (AMETHIA) 0.15-0.03 &0.01 MG tablet Take 1 tablet by mouth daily. 02/23/20  Yes Farrel Conners, CNM  loratadine (CLARITIN) 10 MG tablet Take 1 tablet by mouth daily.   Yes [provider]    Physical Exam Vitals: Blood pressure 110/70, height 5\' 5"  (1.651 m), weight 210 lb (95.3 kg),  last menstrual period 03/09/2021.  General: NAD HEENT: normocephalic, anicteric Thyroid: no enlargement, no palpable nodules Pulmonary: No increased work of breathing, CTAB Cardiovascular: RRR, distal pulses 2+ Breast: Breast symmetrical, no tenderness, no palpable nodules or masses, no skin or nipple retraction present, no nipple discharge.  No axillary or supraclavicular lymphadenopathy.slightly cystic in places. Abdomen: NABS, soft, non-tender, non-distended.  Umbilicus without lesions.  No hepatomegaly, splenomegaly or masses palpable. No evidence of hernia  Genitourinary:  External: Normal external female genitalia.  Normal urethral meatus, normal Bartholin's and Skene's glands.    Vagina: Normal vaginal mucosa, no evidence of prolapse.    Cervix: Grossly normal in appearance, no bleeding  Uterus: Non-enlarged, mobile, normal contour.  No CMT  Adnexa: ovaries non-enlarged, no adnexal masses  Rectal: deferred  Lymphatic: no evidence of inguinal lymphadenopathy Extremities: no edema, erythema, or tenderness Neurologic: Grossly intact Psychiatric: mood appropriate, affect full  Female chaperone present for pelvic and breast  portions of the physical exam    Assessment: 40 y.o. G2P2002 routine annual exam  Plan: Problem List Items Addressed This Visit       Other   Contraception management - Primary   Relevant Medications   Levonorgestrel-Ethinyl Estradiol (AMETHIA) 0.15-0.03 &0.01 MG tablet   Other Visit Diagnoses     Women's annual routine gynecological examination       Cervical cancer screening           2) STI screening  wasoffered and declined  2)  ASCCP guidelines and rational discussed.  Patient opts for every 3 years screening interval  3) Contraception - the patient is currently using  OCP (estrogen/progesterone).  She is happy with her current form of contraception and plans to continue  4) Routine healthcare maintenance including cholesterol, diabetes  screening discussed managed by PCP  5) Return in about 1 year (around 03/21/2022) for annual.   05/21/2022, CNM  03/21/2021 4:14 PM   [facility], [facility] 03/21/2021, 4:14 PM

## 2022-04-12 ENCOUNTER — Other Ambulatory Visit: Payer: Self-pay | Admitting: Obstetrics

## 2022-04-12 DIAGNOSIS — Z3041 Encounter for surveillance of contraceptive pills: Secondary | ICD-10-CM

## 2022-06-13 HISTORY — PX: MELANOMA EXCISION: SHX5266

## 2022-09-13 ENCOUNTER — Telehealth: Payer: Self-pay

## 2022-09-13 NOTE — Telephone Encounter (Signed)
Patient calling for refill of Levonorgestrel-Ethinyl Estradiol (AMETHIA) 0.15-0.03 &0.01 MG tablet . Advised last rx sent 04/12/22 #91 with 4 refills. Patient to contact pharmacy to process refill. She does have annual scheduled with Ardeth Perfect, PA 4/58/59. Her last AE was 03/2021. She mentioned not needing a pap smear now except every 3-5 years. Advised still needs annual exam yearly. Patient will keep scheduled appointment.

## 2022-10-23 ENCOUNTER — Ambulatory Visit: Payer: Self-pay | Admitting: Obstetrics and Gynecology

## 2022-11-25 NOTE — Progress Notes (Unsigned)
PCP:  Farrel Conners, CNM (Inactive)   No chief complaint on file.    HPI:      [name] is a 42 y.o. [id number] whose LMP was No LMP recorded., presents today for her annual examination.  Her menses are {norm/abn:715}, lasting {number: 22536} days.  Dysmenorrhea {dysmen:716}. She {does:18564} have intermenstrual bleeding.  Sex activity: {sex active: 315163}.  Last Pap: 09/30/18 Results were: no abnormalities /neg HPV DNA  Hx of STDs: {STD hx:14358}  Last mammogram: 04/23/19 with RT breast u/s due to palpable concern; Results were: normal--routine follow-up in 12 months There is a FH of breast cancer in her mat aunt. There is no FH of ovarian cancer. The patient {does:18564} do self-breast exams. Mom with uterine ca???/ pt with melanoma??  Tobacco use: {tob:20664} Alcohol use: {Alcohol:11675} No drug use.  Exercise: {exercise:31265}  She {does:18564} get adequate calcium and Vitamin D in her diet.  Patient Active Problem List   Diagnosis Date Noted   Contraception management 09/02/2017   Mixed hyperlipidemia 09/02/2017    Past Surgical History:  Procedure Laterality Date   excision of melanoma     left upper arm   KNEE SURGERY Right 2004   fx patella   lymph node removal  2021    Family History  Problem Relation Age of Onset   Hypertension Mother    Thyroid cancer Mother 5   Uterine cancer Mother 32   Melanoma Mother    Hypertension Father    Breast cancer Maternal Aunt 77    Social History   Socioeconomic History   Marital status: Married    Spouse name: Not on file   Number of children: 2   Years of education: Not on file   Highest education level: Not on file  Occupational History   Not on file  Tobacco Use   Smoking status: Never   Smokeless tobacco: Never  Vaping Use   Vaping Use: Never used  Substance and Sexual Activity   Alcohol use: Yes    Alcohol/week: 2.0 - 3.0 standard drinks of alcohol    Types: 2 - 3 Glasses of wine per  week   Drug use: No   Sexual activity: Yes    Birth control/protection: Pill  Other Topics Concern   Not on file  Social History Narrative   Not on file   Social Determinants of Health   Financial Resource Strain: Not on file  Food Insecurity: Not on file  Transportation Needs: Not on file  Physical Activity: Sufficiently Active (09/30/2018)   Exercise Vital Sign    Days of Exercise per Week: 5 days    Minutes of Exercise per Session: 30 min  Stress: No Stress Concern Present (09/30/2018)   [employer] of Occupational Health - Occupational Stress Questionnaire    Feeling of Stress : Only a little  Social Connections: Moderately Integrated (09/30/2018)   Social Connection and Isolation Panel [NHANES]    Frequency of Communication with Friends and Family: More than three times a week    Frequency of Social Gatherings with Friends and Family: Three times a week    Attends Religious Services: More than 4 times per year    Active Member of Clubs or Organizations: No    Attends Banker Meetings: Never    Marital Status: Married  Catering manager Violence: Not At Risk (09/30/2018)   Humiliation, Afraid, Rape, and Kick questionnaire    Fear of Current or Ex-Partner: No  Emotionally Abused: No    Physically Abused: No    Sexually Abused: No     Current Outpatient Medications:    EPINEPHRINE 0.3 mg/0.3 mL IJ SOAJ injection, INJECT 0.3 MLS INTO THE MUSCLE ONCE AS NEEDED FOR UP TO 1 DOSE (HYPERSENSITIVITY REACTION)., Disp: 2 each, Rfl: 2   Levonorgestrel-Ethinyl Estradiol (AMETHIA) 0.15-0.03 &0.01 MG tablet, TAKE 1 TABLET BY MOUTH EVERY DAY, Disp: 91 tablet, Rfl: 4   loratadine (CLARITIN) 10 MG tablet, Take 1 tablet by mouth daily., Disp: , Rfl:      ROS:  Review of Systems BREAST: No symptoms   Objective: There were no vitals taken for this visit.   OBGyn Exam  Results: No results found for this or any previous visit (from the past 24  hour(s)).  Assessment/Plan: No diagnosis found.  No orders of the defined types were placed in this encounter.            GYN counsel {counseling: 16159}     F/U  No follow-ups on file.  Pasquale Matters B. Kristalynn Coddington, PA-C 11/25/2022 8:47 AM

## 2022-11-26 ENCOUNTER — Encounter: Payer: Self-pay | Admitting: Obstetrics and Gynecology

## 2022-11-26 ENCOUNTER — Other Ambulatory Visit (HOSPITAL_COMMUNITY)
Admission: RE | Admit: 2022-11-26 | Discharge: 2022-11-26 | Disposition: A | Discharge status: Home / Self Care | Payer: BC Managed Care – PPO | Source: Ambulatory Visit | Attending: Obstetrics and Gynecology | Admitting: Obstetrics and Gynecology

## 2022-11-26 ENCOUNTER — Ambulatory Visit (INDEPENDENT_AMBULATORY_CARE_PROVIDER_SITE_OTHER): Payer: BC Managed Care – PPO | Admitting: Obstetrics and Gynecology

## 2022-11-26 VITALS — BP 124/90 | Ht 65.0 in | Wt 221.0 lb

## 2022-11-26 DIAGNOSIS — Z1151 Encounter for screening for human papillomavirus (HPV): Secondary | ICD-10-CM | POA: Insufficient documentation

## 2022-11-26 DIAGNOSIS — Z3041 Encounter for surveillance of contraceptive pills: Secondary | ICD-10-CM

## 2022-11-26 DIAGNOSIS — Z124 Encounter for screening for malignant neoplasm of cervix: Secondary | ICD-10-CM

## 2022-11-26 DIAGNOSIS — Z01419 Encounter for gynecological examination (general) (routine) without abnormal findings: Secondary | ICD-10-CM | POA: Diagnosis not present

## 2022-11-26 DIAGNOSIS — Z1231 Encounter for screening mammogram for malignant neoplasm of breast: Secondary | ICD-10-CM

## 2022-11-26 DIAGNOSIS — Z803 Family history of malignant neoplasm of breast: Secondary | ICD-10-CM

## 2022-11-26 DIAGNOSIS — Z85820 Personal history of malignant melanoma of skin: Secondary | ICD-10-CM

## 2022-11-26 MED ORDER — LEVONORGEST-ETH ESTRAD 91-DAY 0.15-0.03 &0.01 MG PO TABS: 1.0000 | ORAL_TABLET | Freq: Every day | ORAL | 4 refills | Status: DC

## 2022-11-26 NOTE — Patient Instructions (Addendum)
I value your feedback and you entrusting us with your care. If you get a [facility] patient survey, I would appreciate you taking the time to let us know about your experience today. Thank you!  [facility]: [phone]      

## 2022-11-29 LAB — CYTOLOGY - PAP
Comment: NEGATIVE
Diagnosis: NEGATIVE
Diagnosis: REACTIVE
High risk HPV: NEGATIVE

## 2024-01-20 ENCOUNTER — Other Ambulatory Visit: Payer: Self-pay

## 2024-01-20 DIAGNOSIS — Z3041 Encounter for surveillance of contraceptive pills: Secondary | ICD-10-CM

## 2024-01-20 MED ORDER — LEVONORGEST-ETH ESTRAD 91-DAY 0.15-0.03 &0.01 MG PO TABS: 1.0000 | ORAL_TABLET | Freq: Every day | ORAL | 0 refills | Status: DC

## 2024-01-25 ENCOUNTER — Other Ambulatory Visit: Payer: Self-pay | Admitting: Obstetrics and Gynecology

## 2024-01-25 DIAGNOSIS — Z3041 Encounter for surveillance of contraceptive pills: Secondary | ICD-10-CM

## 2024-01-30 ENCOUNTER — Ambulatory Visit: Payer: Self-pay | Admitting: Obstetrics and Gynecology

## 2024-02-05 NOTE — Progress Notes (Unsigned)
 PCP:  Rilla Barnacle, CNM (Inactive)   No chief complaint on file.    HPI:      [name] is a 43 y.o. [id number] whose LMP was No LMP recorded., presents today for her annual examination.  Her menses are every 3 months on OCPs, lasting 4-5 days.  Dysmenorrhea mild improved with tylenol, infrequent BTB.   Sex activity: single partner, contraception - OCP (estrogen/progesterone). No pain/bleeding. Last Pap: 11/26/22 Results were: no abnormalities /neg HPV DNA   Last mammogram: 04/23/19 with RT breast u/s due to palpable concern; Results were: normal--routine follow-up in 12 months There is a FH of breast cancer in her mat aunt. There is no FH of ovarian cancer. The patient does do self-breast exams. Mom with melanoma (s/p hyst but didn't have uterine cancer);  pt with melanoma. Seeing derm regularly.   Tobacco use: The patient denies current or previous tobacco use. Alcohol use: social drinker No drug use.  Exercise: moderately active  She does get adequate calcium but not Vitamin D in her diet.  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    Past Surgical History:  Procedure Laterality Date   excision of melanoma  01/2019   left upper arm   KNEE SURGERY Right 2004   fx patella   lymph node removal  2021   MELANOMA EXCISION  06/2022   Upper Back    Family History  Problem Relation Age of Onset   Hypertension Mother    Thyroid cancer Mother 68   Melanoma Mother    Hypertension Father    Breast cancer Maternal Aunt 54    Social History   Socioeconomic History   Marital status: Married    Spouse name: Not on file   Number of children: 2   Years of education: Not on file   Highest education level: Not on file   Occupational History   Not on file  Tobacco Use   Smoking status: Never   Smokeless tobacco: Never  Vaping Use   Vaping status: Never Used  Substance and Sexual Activity   Alcohol use: Yes    Alcohol/week: 2.0 - 3.0 standard drinks of alcohol    Types: 2 - 3 Glasses of wine per week   Drug use: No   Sexual activity: Yes    Birth control/protection: Pill  Other Topics Concern   Not on file  Social History Narrative   Not on file   Social Drivers of Health   Financial Resource Strain: Not on file  Food Insecurity: Not on file  Transportation Needs: Not on file  Physical Activity: Sufficiently Active (09/30/2018)   Exercise Vital Sign    Days of Exercise per Week: 5 days    Minutes of Exercise per Session: 30 min  Stress: No Stress Concern Present (09/30/2018)   [employer] of Occupational Health - Occupational Stress Questionnaire    Feeling of Stress : Only a little  Social Connections: Moderately Integrated (09/30/2018)   Social Connection and Isolation Panel    Frequency of Communication with Friends and Family: More than three times a week    Frequency of Social Gatherings with Friends and Family: Three times a week  Attends Religious Services: More than 4 times per year    Active Member of Clubs or Organizations: No    Attends Banker Meetings: Never    Marital Status: Married  Catering manager Violence: Not At Risk (09/30/2018)   Humiliation, Afraid, Rape, and Kick questionnaire    Fear of Current or Ex-Partner: No    Emotionally Abused: No    Physically Abused: No    Sexually Abused: No     Current Outpatient Medications:    clobetasol cream (TEMOVATE) 0.05 %, Apply topically., Disp: , Rfl:    EPINEPHRINE  0.3 mg/0.3 mL IJ SOAJ injection, INJECT 0.3 MLS INTO THE MUSCLE ONCE AS NEEDED FOR UP TO 1 DOSE (HYPERSENSITIVITY REACTION)., Disp: 2 each, Rfl: 2   Levonorgestrel-Ethinyl Estradiol  (AMETHIA) 0.15-0.03 &0.01 MG tablet, Take 1 tablet by mouth  daily., Disp: 91 tablet, Rfl: 0   loratadine (CLARITIN) 10 MG tablet, Take 1 tablet by mouth daily., Disp: , Rfl:      ROS:  Review of Systems  Constitutional:  Negative for fatigue, fever and unexpected weight change.  Respiratory:  Negative for cough, shortness of breath and wheezing.   Cardiovascular:  Negative for chest pain, palpitations and leg swelling.  Gastrointestinal:  Negative for blood in stool, constipation, diarrhea, nausea and vomiting.  Endocrine: Negative for cold intolerance, heat intolerance and polyuria.  Genitourinary:  Negative for dyspareunia, dysuria, flank pain, frequency, genital sores, hematuria, menstrual problem, pelvic pain, urgency, vaginal bleeding, vaginal discharge and vaginal pain.  Musculoskeletal:  Negative for back pain, joint swelling and myalgias.  Skin:  Negative for rash.  Neurological:  Negative for dizziness, syncope, light-headedness, numbness and headaches.  Hematological:  Negative for adenopathy.  Psychiatric/Behavioral:  Positive for agitation. Negative for confusion, sleep disturbance and suicidal ideas. The patient is not nervous/anxious.    BREAST: No symptoms   Objective: There were no vitals taken for this visit.   Physical Exam Constitutional:      Appearance: She is well-developed.  Genitourinary:     Vulva normal.     Right Labia: No rash, tenderness or lesions.    Left Labia: No tenderness, lesions or rash.    No vaginal discharge, erythema or tenderness.      Right Adnexa: not tender and no mass present.    Left Adnexa: not tender and no mass present.    No cervical friability or polyp.     Uterus is not enlarged or tender.  Breasts:    Right: No mass, nipple discharge, skin change or tenderness.     Left: No mass, nipple discharge, skin change or tenderness.  Neck:     Thyroid: No thyromegaly.   Cardiovascular:     Rate and Rhythm: Normal rate and regular rhythm.     Heart sounds: Normal heart sounds. No  murmur heard. Pulmonary:     Effort: Pulmonary effort is normal.     Breath sounds: Normal breath sounds.  Abdominal:     Palpations: Abdomen is soft.     Tenderness: There is no abdominal tenderness. There is no guarding or rebound.   Musculoskeletal:        General: Normal range of motion.     Cervical back: Normal range of motion.  Lymphadenopathy:     Cervical: No cervical adenopathy.   Neurological:     General: No focal deficit present.     Mental Status: She is alert and oriented to person, place, and time.     Cranial Nerves: No  cranial nerve deficit.   Skin:    General: Skin is warm and dry.   Psychiatric:        Mood and Affect: Mood normal.        Behavior: Behavior normal.        Thought Content: Thought content normal.        Judgment: Judgment normal.  Vitals reviewed.    Assessment/Plan: Encounter for annual routine gynecological examination  Cervical cancer screening - Plan: Cytology - PAP  Screening for HPV (human papillomavirus) - Plan: Cytology - PAP  Encounter for surveillance of contraceptive pills - Plan: Levonorgestrel-Ethinyl Estradiol  (AMETHIA) 0.15-0.03 &0.01 MG tablet; OCP RF  Encounter for screening mammogram for malignant neoplasm of breast - Plan: MM 3D SCREENING MAMMOGRAM BILATERAL BREAST; pt to schedule mammo  History of melanoma--pt followed by derm. Will cont to follow FH for cancer genetic testing.   No orders of the defined types were placed in this encounter.            GYN counsel breast self exam, mammography screening, adequate intake of calcium and vitamin D, diet and exercise     F/U  No follow-ups on file.  Zollie Clemence B. Mariadejesus Cade, PA-C 02/05/2024 4:11 PM

## 2024-02-06 ENCOUNTER — Encounter: Payer: Self-pay | Admitting: Obstetrics and Gynecology

## 2024-02-06 ENCOUNTER — Ambulatory Visit (INDEPENDENT_AMBULATORY_CARE_PROVIDER_SITE_OTHER): Payer: Self-pay | Admitting: Obstetrics and Gynecology

## 2024-02-06 VITALS — BP 123/79 | HR 75 | Ht 65.0 in | Wt 217.0 lb

## 2024-02-06 DIAGNOSIS — Z13220 Encounter for screening for lipoid disorders: Secondary | ICD-10-CM

## 2024-02-06 DIAGNOSIS — Z01419 Encounter for gynecological examination (general) (routine) without abnormal findings: Secondary | ICD-10-CM | POA: Diagnosis not present

## 2024-02-06 DIAGNOSIS — Z1231 Encounter for screening mammogram for malignant neoplasm of breast: Secondary | ICD-10-CM

## 2024-02-06 DIAGNOSIS — Z0000 Encounter for general adult medical examination without abnormal findings: Secondary | ICD-10-CM

## 2024-02-06 DIAGNOSIS — Z3041 Encounter for surveillance of contraceptive pills: Secondary | ICD-10-CM

## 2024-02-06 DIAGNOSIS — Z131 Encounter for screening for diabetes mellitus: Secondary | ICD-10-CM

## 2024-02-06 MED ORDER — LEVONORGEST-ETH ESTRAD 91-DAY 0.15-0.03 &0.01 MG PO TABS: 1.0000 | ORAL_TABLET | Freq: Every day | ORAL | 3 refills | Status: AC

## 2024-02-06 NOTE — Patient Instructions (Signed)
 I value your feedback and you entrusting [place] with your care. If you get a [facility] patient survey, I would appreciate you taking the time to let us know about your experience today. Thank you!  [facility] ([place])--[phone]

## 2024-02-10 ENCOUNTER — Other Ambulatory Visit

## 2024-02-10 DIAGNOSIS — Z131 Encounter for screening for diabetes mellitus: Secondary | ICD-10-CM

## 2024-02-10 DIAGNOSIS — Z0000 Encounter for general adult medical examination without abnormal findings: Secondary | ICD-10-CM

## 2024-02-10 DIAGNOSIS — Z13220 Encounter for screening for lipoid disorders: Secondary | ICD-10-CM

## 2024-02-11 ENCOUNTER — Ambulatory Visit: Payer: Self-pay | Admitting: Obstetrics and Gynecology

## 2024-02-11 LAB — COMPREHENSIVE METABOLIC PANEL WITH GFR
ALT: 17 IU/L (ref 0–32)
AST: 14 IU/L (ref 0–40)
Albumin: 4 g/dL (ref 3.9–4.9)
Alkaline Phosphatase: 85 IU/L (ref 44–121)
BUN/Creatinine Ratio: 9 (ref 9–23)
BUN: 7 mg/dL (ref 6–24)
Bilirubin Total: 0.4 mg/dL (ref 0.0–1.2)
CO2: 20 mmol/L (ref 20–29)
Calcium: 8.9 mg/dL (ref 8.7–10.2)
Chloride: 102 mmol/L (ref 96–106)
Creatinine, Ser: 0.74 mg/dL (ref 0.57–1.00)
Globulin, Total: 2.6 g/dL (ref 1.5–4.5)
Glucose: 77 mg/dL (ref 70–99)
Potassium: 4.6 mmol/L (ref 3.5–5.2)
Sodium: 139 mmol/L (ref 134–144)
Total Protein: 6.6 g/dL (ref 6.0–8.5)
eGFR: 104 mL/min/{1.73_m2} (ref >59)

## 2024-02-11 LAB — LIPID PANEL
Chol/HDL Ratio: 4.5 ratio — ABNORMAL HIGH (ref 0.0–4.4)
Cholesterol, Total: 263 mg/dL — ABNORMAL HIGH (ref 100–199)
HDL: 59 mg/dL (ref >39)
LDL Chol Calc (NIH): 175 mg/dL — ABNORMAL HIGH (ref 0–99)
Triglycerides: 158 mg/dL — ABNORMAL HIGH (ref 0–149)
VLDL Cholesterol Cal: 29 mg/dL (ref 5–40)

## 2024-02-11 LAB — HEMOGLOBIN A1C
Est. average glucose Bld gHb Est-mCnc: 105 mg/dL
Hgb A1c MFr Bld: 5.3 % (ref 4.8–5.6)

## 2024-02-16 ENCOUNTER — Other Ambulatory Visit: Payer: Self-pay | Admitting: Obstetrics and Gynecology

## 2024-02-16 DIAGNOSIS — E782 Mixed hyperlipidemia: Secondary | ICD-10-CM
